# Patient Record
Sex: Female | Born: 1975 | Race: Black or African American | Hispanic: No | Marital: Married | Smoking: Never smoker
Health system: Southern US, Community
[De-identification: ages and names within clinical notes are randomized; demographics above are authoritative.]

## PROBLEM LIST (undated history)

## (undated) DIAGNOSIS — E119 Type 2 diabetes mellitus without complications: Secondary | ICD-10-CM

## (undated) DIAGNOSIS — E785 Hyperlipidemia, unspecified: Secondary | ICD-10-CM

## (undated) HISTORY — DX: Type 2 diabetes mellitus without complications: E11.9

## (undated) HISTORY — DX: Hyperlipidemia, unspecified: E78.5

---

## 2016-08-25 ENCOUNTER — Ambulatory Visit
Admission: EM | Admit: 2016-08-25 | Discharge: 2016-08-25 | Disposition: A | Discharge status: Home / Self Care | Payer: Managed Care, Other (non HMO) | Attending: Family Medicine | Admitting: Family Medicine

## 2016-08-25 DIAGNOSIS — G43009 Migraine without aura, not intractable, without status migrainosus: Secondary | ICD-10-CM

## 2016-08-25 MED ORDER — HYDROCODONE-ACETAMINOPHEN 5-325 MG PO TABS: 1.0000 | ORAL_TABLET | Freq: Four times a day (QID) | ORAL | 0 refills | Status: DC | PRN

## 2016-08-25 NOTE — ED Triage Notes (Signed)
Patient c/o headache for about 2 days and some dizziness. She took 800 mg Ibuprofen and it took the edge off but it just keeps coming back. She also states that he head hurts on a certain spot in her head and that she has pains that shoot down her neck.

## 2016-08-25 NOTE — ED Provider Notes (Signed)
[facility]    CSN: [id number] Arrival date & time: 08/25/16  0830     History   Chief Complaint Chief Complaint  Patient presents with  . Headache    Light Headed    HPI Lisa Keller is a 40 y.o. female.   The history is provided by the patient.  Headache  Pain location:  L parietal, L temporal and occipital Quality:  Dull Radiates to:  L neck Severity currently:  2/10 Severity at highest:  8/10 Onset quality:  Sudden Duration:  2 days Timing:  Intermittent Chronicity:  New Similar to prior headaches: yes   Context: not activity, not exposure to bright light, not caffeine, not coughing, not defecating, not eating, not stress, not exposure to cold air, not intercourse, not loud noise and not straining   Relieved by:  NSAIDs Associated symptoms: nausea and photophobia   Associated symptoms: no abdominal pain, no back pain, no blurred vision, no congestion, no cough, no diarrhea, no dizziness, no drainage, no ear pain, no eye pain, no facial pain, no fatigue, no fever, no focal weakness, no hearing loss, no loss of balance, no myalgias, no near-syncope, no neck pain, no neck stiffness, no numbness, no paresthesias, no seizures, no sinus pressure, no sore throat, no swollen glands, no syncope, no tingling, no URI, no visual change, no vomiting and no weakness     History reviewed. No pertinent past medical history.  There are no active problems to display for this patient.   History reviewed. No pertinent surgical history.  OB History    No data available       Home Medications    Prior to Admission medications   Medication Sig Start Date End Date Taking? Authorizing Provider  ibuprofen (ADVIL,MOTRIN) 800 MG tablet Take 800 mg by mouth as needed.   Yes Historical Provider, MD  norgestimate-ethinyl estradiol (ORTHO-CYCLEN,SPRINTEC,PREVIFEM) 0.25-35 MG-MCG tablet Take 1 tablet by mouth daily.   Yes Historical Provider, MD  HYDROcodone-acetaminophen  (NORCO/VICODIN) 5-325 MG tablet Take 1-2 tablets by mouth every 6 (six) hours as needed. 08/25/16   Payton Mccallum, MD    Family History Family History  Problem Relation Age of Onset  . Diabetes Mother   . Hypertension Father   . Diabetes Father     Social History Social History  Substance Use Topics  . Smoking status: Never Smoker  . Smokeless tobacco: Never Used  . Alcohol use No     Allergies   Review of patient's allergies indicates no known allergies.   Review of Systems Review of Systems  Constitutional: Negative for fatigue and fever.  HENT: Negative for congestion, ear pain, hearing loss, postnasal drip, sinus pressure and sore throat.   Eyes: Positive for photophobia. Negative for blurred vision and pain.  Respiratory: Negative for cough.   Cardiovascular: Negative for syncope and near-syncope.  Gastrointestinal: Positive for nausea. Negative for abdominal pain, diarrhea and vomiting.  Musculoskeletal: Negative for back pain, myalgias, neck pain and neck stiffness.  Neurological: Positive for headaches. Negative for dizziness, focal weakness, seizures, weakness, numbness, paresthesias and loss of balance.     Physical Exam Triage Vital Signs ED Triage Vitals  Enc Vitals Group     BP 08/25/16 0905 (!) 158/82     Pulse Rate 08/25/16 0905 (!) 56     Resp 08/25/16 0905 18     Temp 08/25/16 0905 98.9 F (37.2 C)     Temp Source 08/25/16 0905 Oral     SpO2 08/25/16  0905 100 %     Weight 08/25/16 0905 183 lb (83 kg)     Height 08/25/16 0905 5\' 6"  (1.676 m)     Head Circumference --      Peak Flow --      Pain Score 08/25/16 0904 7     Pain Loc --      Pain Edu? --      Excl. in GC? --    No data found.   Updated Vital Signs BP (!) 158/82 (BP Location: Left Arm)   Pulse (!) 56   Temp 98.9 F (37.2 C) (Oral)   Resp 18   Ht 5\' 6"  (1.676 m)   Wt 183 lb (83 kg)   LMP 08/14/2016   SpO2 100%   BMI 29.54 kg/m   Visual Acuity Right Eye Distance:     Left Eye Distance:   Bilateral Distance:    Right Eye Near:   Left Eye Near:    Bilateral Near:     Physical Exam  Constitutional: She is oriented to person, place, and time. She appears well-developed and well-nourished. No distress.  HENT:  Head: Normocephalic and atraumatic.  Right Ear: Tympanic membrane, external ear and ear canal normal.  Left Ear: Tympanic membrane, external ear and ear canal normal.  Nose: No mucosal edema, rhinorrhea, nose lacerations, sinus tenderness, nasal deformity, septal deviation or nasal septal hematoma. No epistaxis.  No foreign bodies. Right sinus exhibits no maxillary sinus tenderness and no frontal sinus tenderness. Left sinus exhibits no maxillary sinus tenderness and no frontal sinus tenderness.  Mouth/Throat: Uvula is midline, oropharynx is clear and moist and mucous membranes are normal. No oropharyngeal exudate.  Eyes: Conjunctivae and EOM are normal. Pupils are equal, round, and reactive to light. Right eye exhibits no discharge. Left eye exhibits no discharge. No scleral icterus.  Neck: Normal range of motion. Neck supple. No thyromegaly present.  Cardiovascular: Normal rate, regular rhythm and normal heart sounds.   Pulmonary/Chest: Effort normal and breath sounds normal. No respiratory distress. She has no wheezes. She has no rales.  Lymphadenopathy:    She has no cervical adenopathy.  Neurological: She is alert and oriented to person, place, and time. She has normal reflexes. She displays normal reflexes. No cranial nerve deficit. She exhibits normal muscle tone. Coordination normal.  Skin: She is not diaphoretic.  Psychiatric: Her behavior is normal. Thought content normal.  Nursing note and vitals reviewed.    UC Treatments / Results  Labs (all labs ordered are listed, but only abnormal results are displayed) Labs Reviewed - No data to display  EKG  EKG Interpretation None       Radiology No results  found.  Procedures Procedures (including critical care time)  Medications Ordered in UC Medications - No data to display   Initial Impression / Assessment and Plan / UC Course  I have reviewed the triage vital signs and the nursing notes.  Pertinent labs & imaging results that were available during my care of the patient were reviewed by me and considered in my medical decision making (see chart for details).  Clinical Course      Final Clinical Impressions(s) / UC Diagnoses   Final diagnoses:  Migraine without aura and without status migrainosus, not intractable    New Prescriptions Discharge Medication List as of 08/25/2016  9:26 AM    START taking these medications   Details  HYDROcodone-acetaminophen (NORCO/VICODIN) 5-325 MG tablet Take 1-2 tablets by mouth every 6 (six)  hours as needed., Starting Tue 08/25/2016, Print       1. diagnosis reviewed with patient 2. rx as per orders above; reviewed possible side effects, interactions, risks and benefits  3. Recommend supportive treatment otc NSAIDS prn 4. Follow-up prn if symptoms worsen or don't improve   Payton Mccallumrlando Nattaly Yebra, MD 08/25/16 1024

## 2016-08-29 ENCOUNTER — Telehealth: Payer: Self-pay

## 2016-08-29 NOTE — Telephone Encounter (Signed)
Courtesy call back completed today for patient's recent visit at [facility]. Patient did not answer, left message on machine to call back with any questions or concerns.   

## 2018-10-14 ENCOUNTER — Other Ambulatory Visit: Payer: Self-pay | Admitting: Obstetrics and Gynecology

## 2018-10-14 DIAGNOSIS — Z1231 Encounter for screening mammogram for malignant neoplasm of breast: Secondary | ICD-10-CM

## 2018-10-20 ENCOUNTER — Ambulatory Visit
Admission: RE | Admit: 2018-10-20 | Discharge: 2018-10-20 | Disposition: A | Discharge status: Home / Self Care | Payer: 59 | Source: Ambulatory Visit | Attending: Obstetrics and Gynecology | Admitting: Obstetrics and Gynecology

## 2018-10-20 DIAGNOSIS — Z1231 Encounter for screening mammogram for malignant neoplasm of breast: Secondary | ICD-10-CM | POA: Diagnosis present

## 2019-10-24 ENCOUNTER — Other Ambulatory Visit: Payer: Self-pay | Admitting: Obstetrics and Gynecology

## 2019-10-24 DIAGNOSIS — Z1231 Encounter for screening mammogram for malignant neoplasm of breast: Secondary | ICD-10-CM

## 2020-01-31 ENCOUNTER — Ambulatory Visit
Admission: RE | Admit: 2020-01-31 | Discharge: 2020-01-31 | Disposition: A | Discharge status: Home / Self Care | Payer: 59 | Source: Ambulatory Visit | Attending: Obstetrics and Gynecology | Admitting: Obstetrics and Gynecology

## 2020-01-31 DIAGNOSIS — Z1231 Encounter for screening mammogram for malignant neoplasm of breast: Secondary | ICD-10-CM

## 2020-03-09 ENCOUNTER — Ambulatory Visit: Payer: 59 | Attending: Internal Medicine

## 2020-03-09 DIAGNOSIS — Z23 Encounter for immunization: Secondary | ICD-10-CM

## 2020-03-09 NOTE — Progress Notes (Signed)
   Covid-19 Vaccination Clinic  Name:  Patti Shorb    MRN: [id number] DOB: September 13, 1976  03/09/2020  Ms. Lax was observed post Covid-19 immunization for 15 minutes without incident. She was provided with Vaccine Information Sheet and instruction to access the V-Safe system.   Ms. Kuck was instructed to call 911 with any severe reactions post vaccine: Marland Kitchen Difficulty breathing  . Swelling of face and throat  . A fast heartbeat  . A bad rash all over body  . Dizziness and weakness   Immunizations Administered    Name Date Dose VIS Date Route   Pfizer COVID-19 Vaccine 03/09/2020 12:44 PM 0.3 mL 11/10/2019 Intramuscular   Manufacturer: [employer], [employer]   Lot: [id number]   NDC: [address]-1

## 2020-04-06 ENCOUNTER — Ambulatory Visit: Payer: 59 | Attending: Internal Medicine

## 2020-04-06 DIAGNOSIS — Z23 Encounter for immunization: Secondary | ICD-10-CM

## 2020-04-06 NOTE — Progress Notes (Signed)
   Covid-19 Vaccination Clinic  Name:  Lisa Keller    MRN: [id number] DOB: 09-27-1976  04/06/2020  [name] was observed post Covid-19 immunization for 15 minutes without incident. She was provided with Vaccine Information Sheet and instruction to access the V-Safe system.   [name] was instructed to call 911 with any severe reactions post vaccine: Marland Kitchen Difficulty breathing  . Swelling of face and throat  . A fast heartbeat  . A bad rash all over body  . Dizziness and weakness   Immunizations Administered    Name Date Dose VIS Date Route   Pfizer COVID-19 Vaccine 04/06/2020 11:33 AM 0.3 mL 01/24/2019 Intramuscular   Manufacturer: [employer], [employer]   Lot: [id number]   NDC: [address]-7

## 2021-01-20 ENCOUNTER — Other Ambulatory Visit: Payer: Self-pay | Admitting: Obstetrics and Gynecology

## 2021-01-20 DIAGNOSIS — Z1231 Encounter for screening mammogram for malignant neoplasm of breast: Secondary | ICD-10-CM

## 2021-02-04 ENCOUNTER — Ambulatory Visit
Admission: RE | Admit: 2021-02-04 | Discharge: 2021-02-04 | Disposition: A | Discharge status: Home / Self Care | Payer: No Typology Code available for payment source | Source: Ambulatory Visit | Attending: Obstetrics and Gynecology | Admitting: Obstetrics and Gynecology

## 2021-02-04 ENCOUNTER — Other Ambulatory Visit: Payer: Self-pay

## 2021-02-04 DIAGNOSIS — Z1231 Encounter for screening mammogram for malignant neoplasm of breast: Secondary | ICD-10-CM | POA: Diagnosis not present

## 2021-10-16 ENCOUNTER — Other Ambulatory Visit: Payer: Self-pay | Admitting: Obstetrics and Gynecology

## 2021-10-16 DIAGNOSIS — Z1231 Encounter for screening mammogram for malignant neoplasm of breast: Secondary | ICD-10-CM

## 2022-01-13 IMAGING — MG DIGITAL SCREENING BILAT W/ TOMO W/ CAD
3 series · 3 of 7 positions shown · non-contrast
Comparison: Previous exam(s).

CLINICAL DATA: Screening.

EXAM:
DIGITAL SCREENING BILATERAL MAMMOGRAM WITH TOMO AND CAD

[R CC synth-2D]
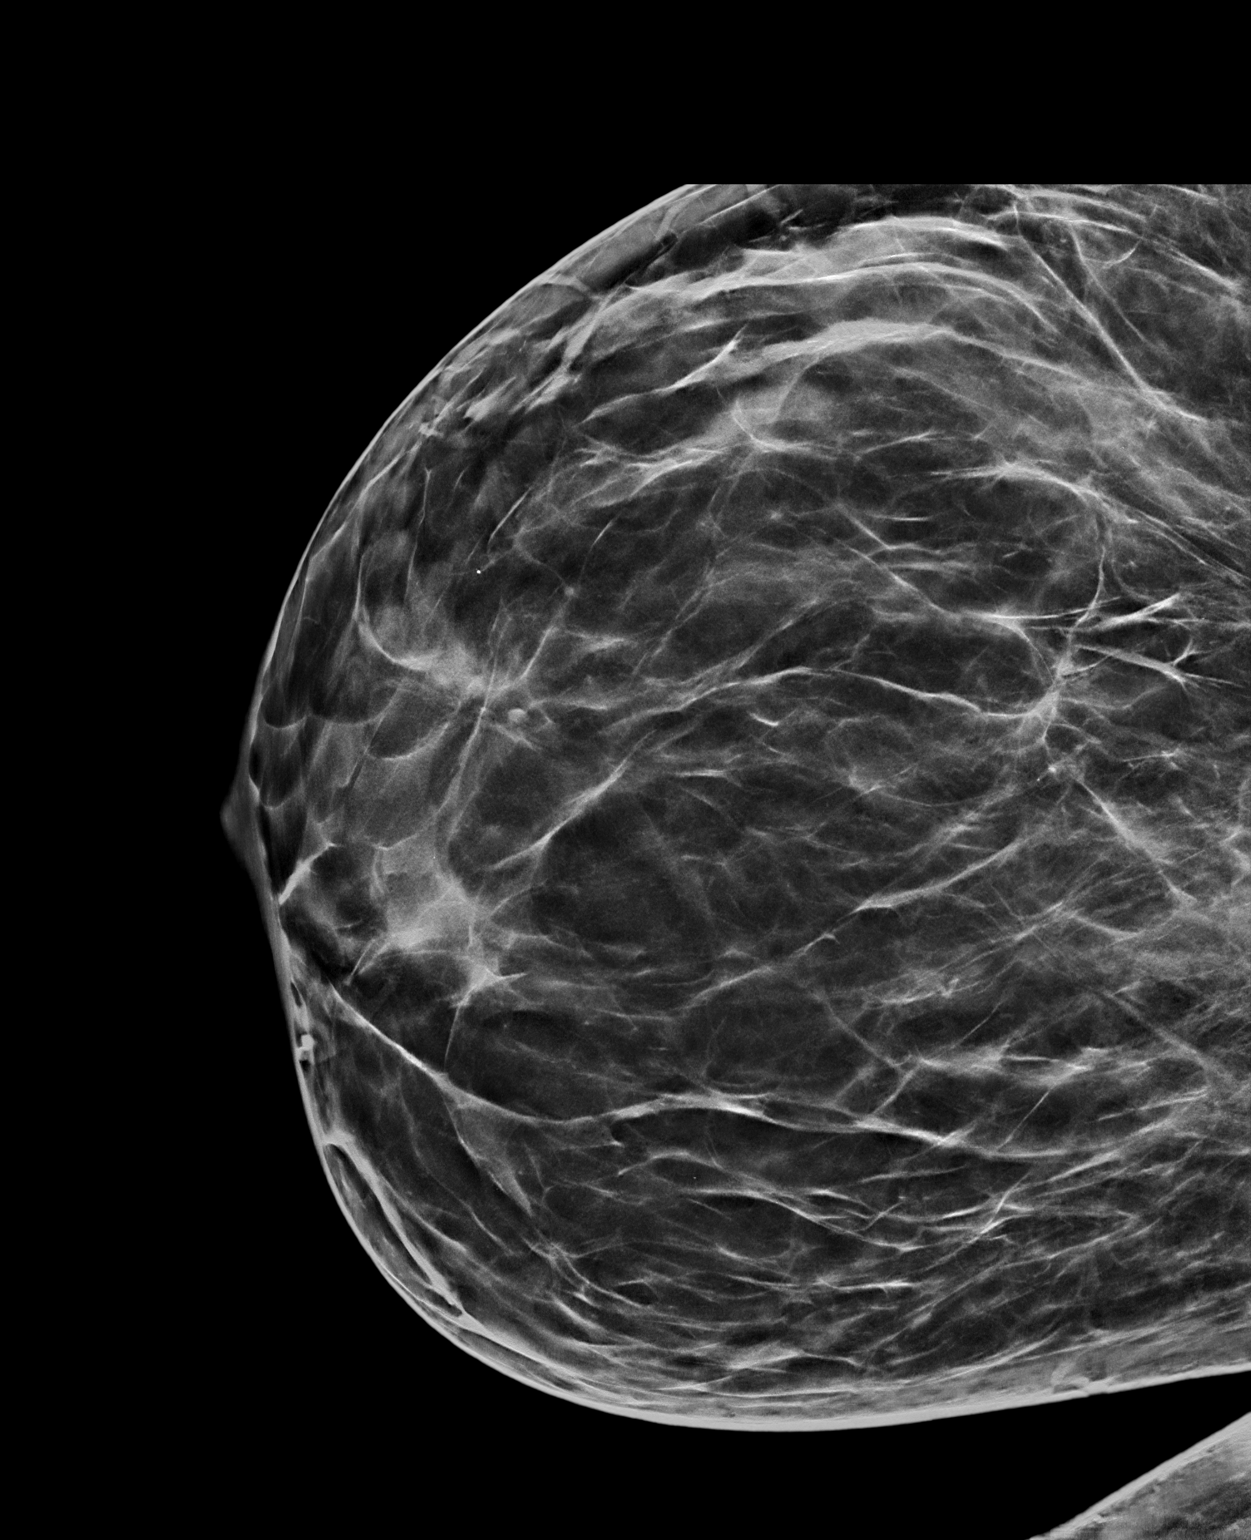

[L CC synth-2D]
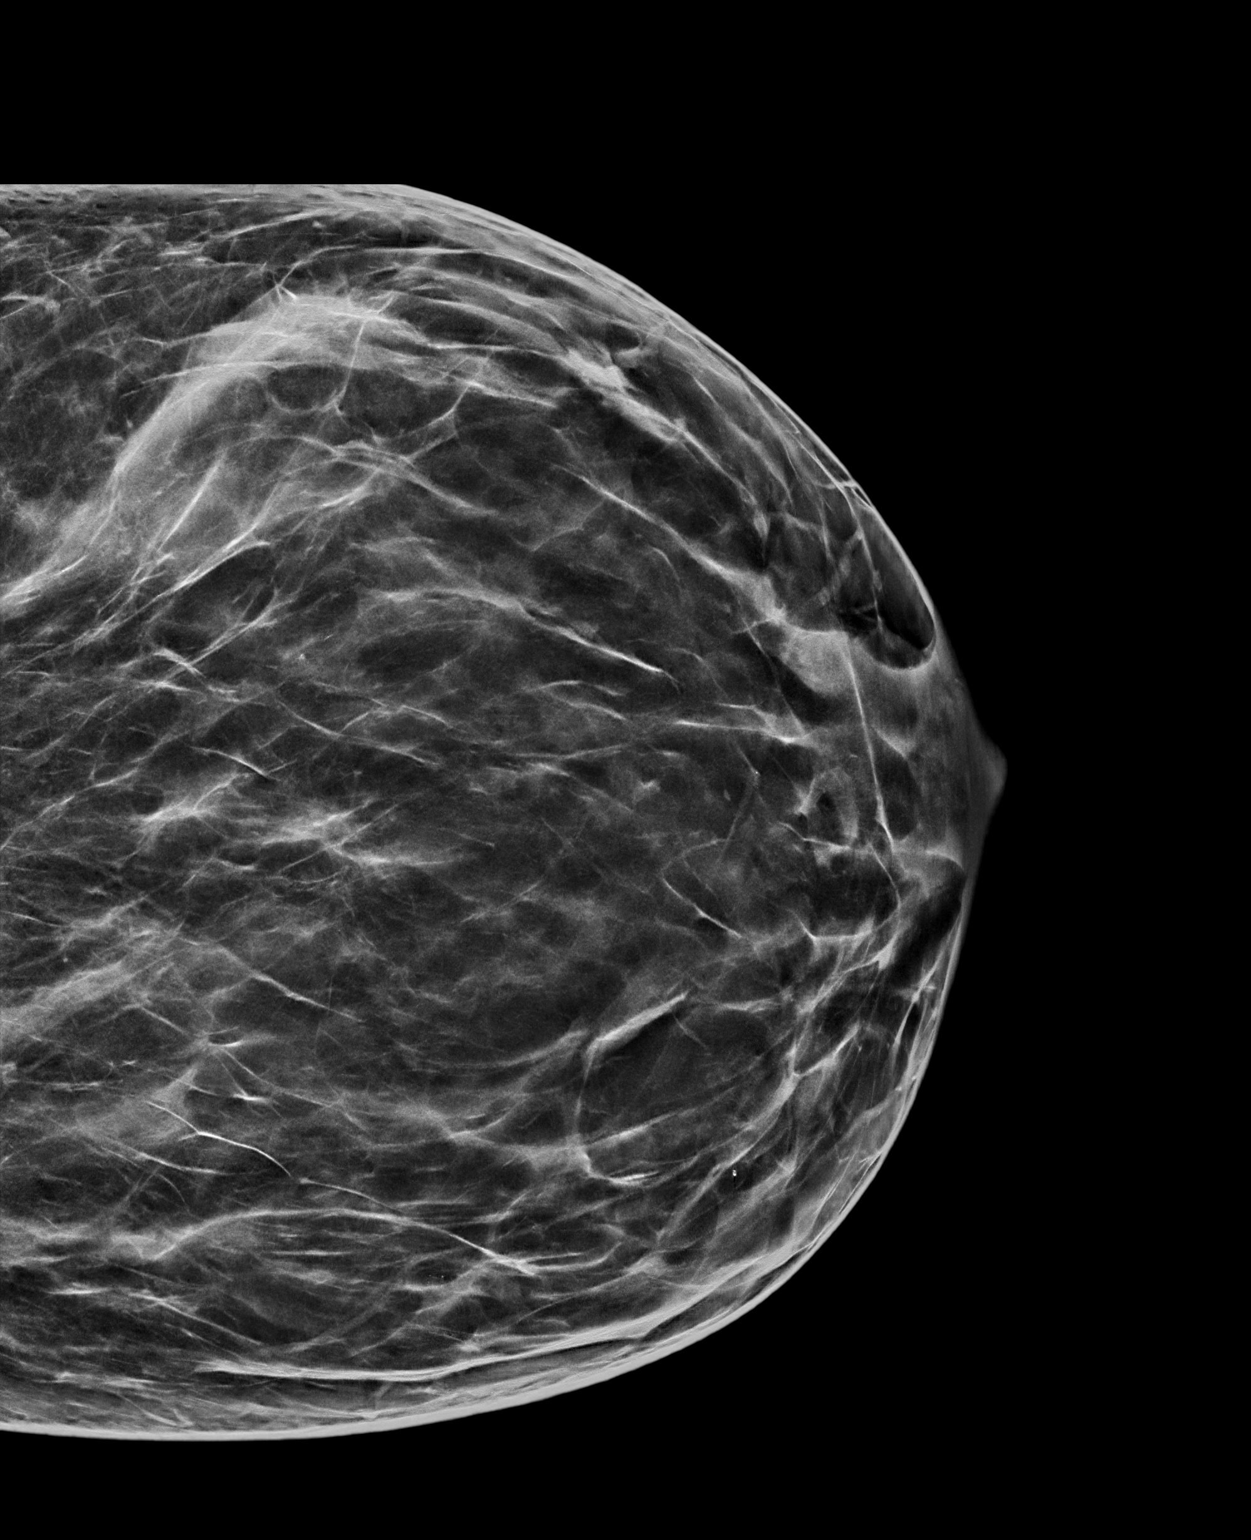

[L CC tomo · tomo slice 40/79.0]
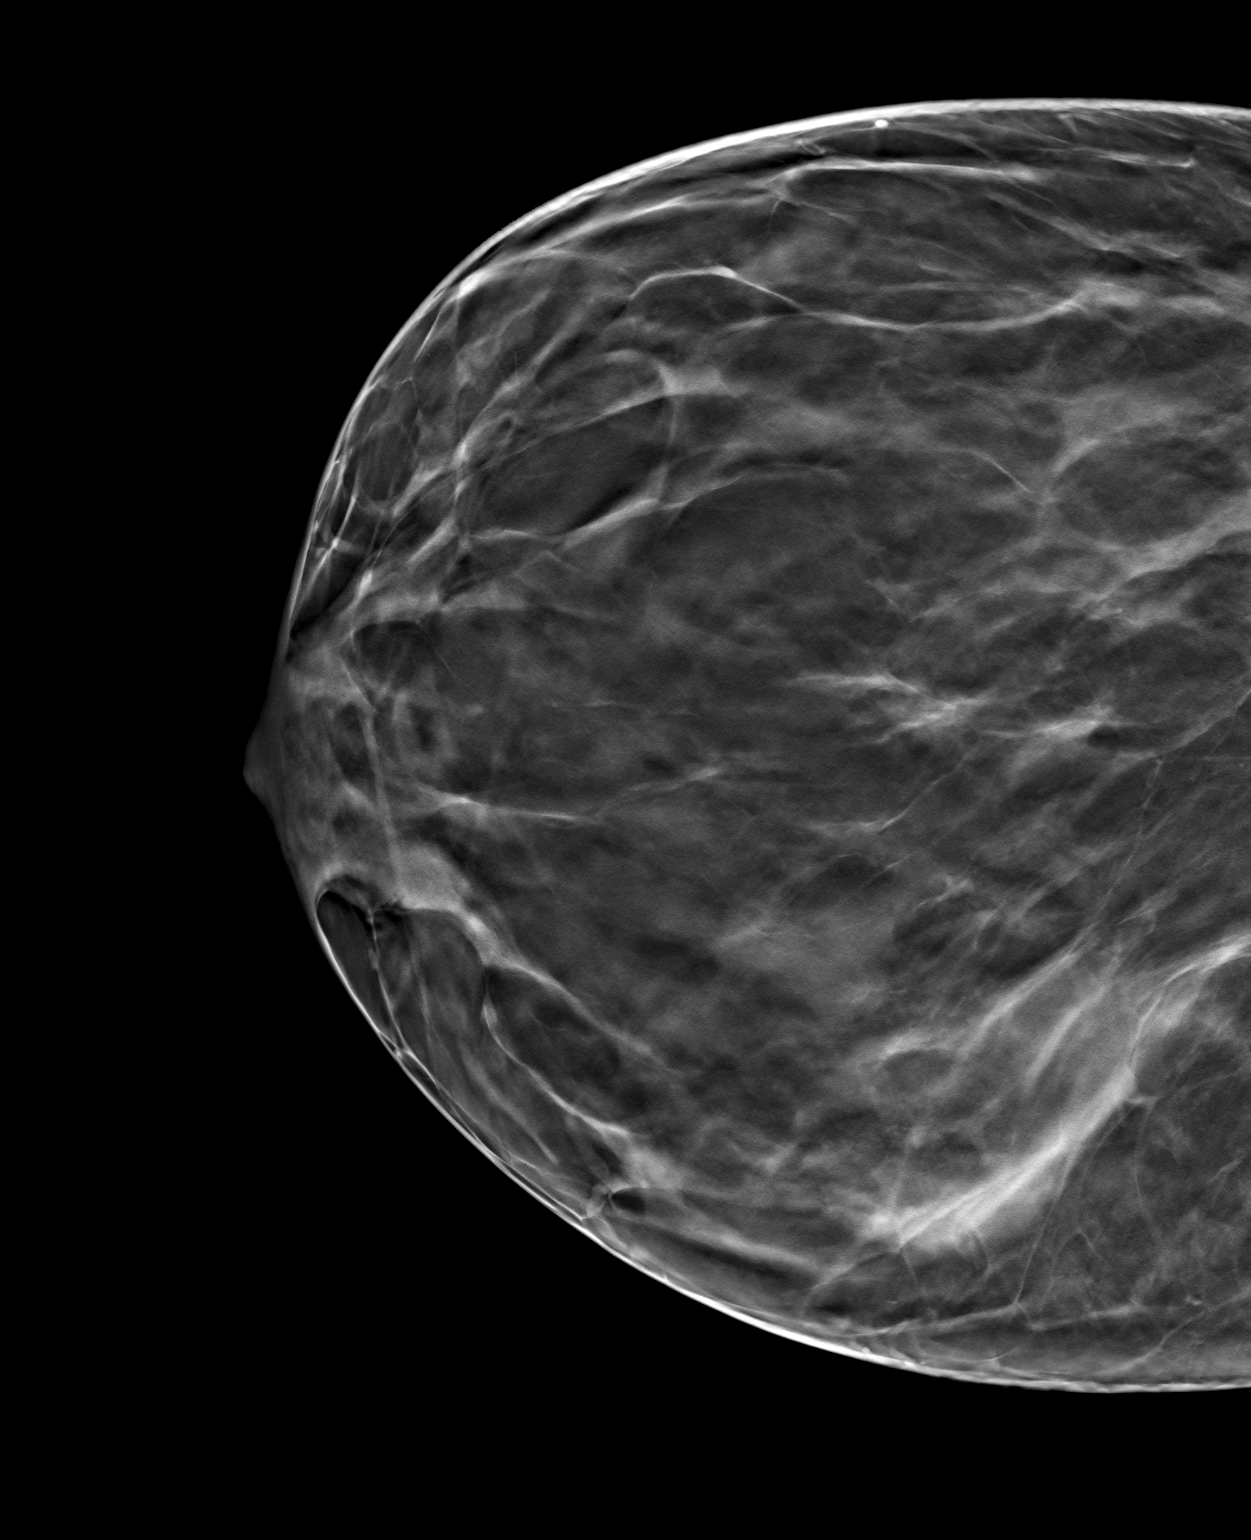

[3 of 7 positions shown; findings below may reference images not displayed]

ACR Breast Density Category c: The breast tissue is heterogeneously
dense, which may obscure small masses.
FINDINGS: There are no findings suspicious for malignancy. Images were
processed with CAD.
IMPRESSION: No mammographic evidence of malignancy. A result letter of this
screening mammogram will be mailed directly to the patient.

RECOMMENDATION:
Screening mammogram in one year. (Code:[id number])

BI-RADS CATEGORY  1: Negative.

## 2022-05-28 ENCOUNTER — Ambulatory Visit
Admission: RE | Admit: 2022-05-28 | Discharge: 2022-05-28 | Disposition: A | Discharge status: Home / Self Care | Payer: BC Managed Care – PPO | Source: Ambulatory Visit | Attending: Obstetrics and Gynecology | Admitting: Obstetrics and Gynecology

## 2022-05-28 ENCOUNTER — Other Ambulatory Visit: Payer: Self-pay | Admitting: Obstetrics and Gynecology

## 2022-05-28 DIAGNOSIS — Z1231 Encounter for screening mammogram for malignant neoplasm of breast: Secondary | ICD-10-CM | POA: Insufficient documentation

## 2022-05-28 DIAGNOSIS — N644 Mastodynia: Secondary | ICD-10-CM

## 2022-06-18 ENCOUNTER — Ambulatory Visit
Admission: RE | Admit: 2022-06-18 | Discharge: 2022-06-18 | Disposition: A | Discharge status: Home / Self Care | Payer: BC Managed Care – PPO | Source: Ambulatory Visit | Attending: Obstetrics and Gynecology | Admitting: Obstetrics and Gynecology

## 2022-06-18 ENCOUNTER — Other Ambulatory Visit: Payer: Self-pay | Admitting: Obstetrics and Gynecology

## 2022-06-18 DIAGNOSIS — N644 Mastodynia: Secondary | ICD-10-CM

## 2022-11-06 ENCOUNTER — Other Ambulatory Visit: Payer: Self-pay | Admitting: Obstetrics and Gynecology

## 2022-11-06 DIAGNOSIS — Z1231 Encounter for screening mammogram for malignant neoplasm of breast: Secondary | ICD-10-CM

## 2023-01-18 IMAGING — MG MM DIGITAL SCREENING BILAT W/ TOMO AND CAD
8 series · 8 of 24 positions shown · non-contrast
Comparison: Previous exam(s).

CLINICAL DATA: Screening.

EXAM:
DIGITAL SCREENING BILATERAL MAMMOGRAM WITH TOMOSYNTHESIS AND CAD
TECHNIQUE: Bilateral screening digital craniocaudal and mediolateral oblique
mammograms were obtained. Bilateral screening digital breast
tomosynthesis was performed. The images were evaluated with
computer-aided detection.

[R CC synth-2D]
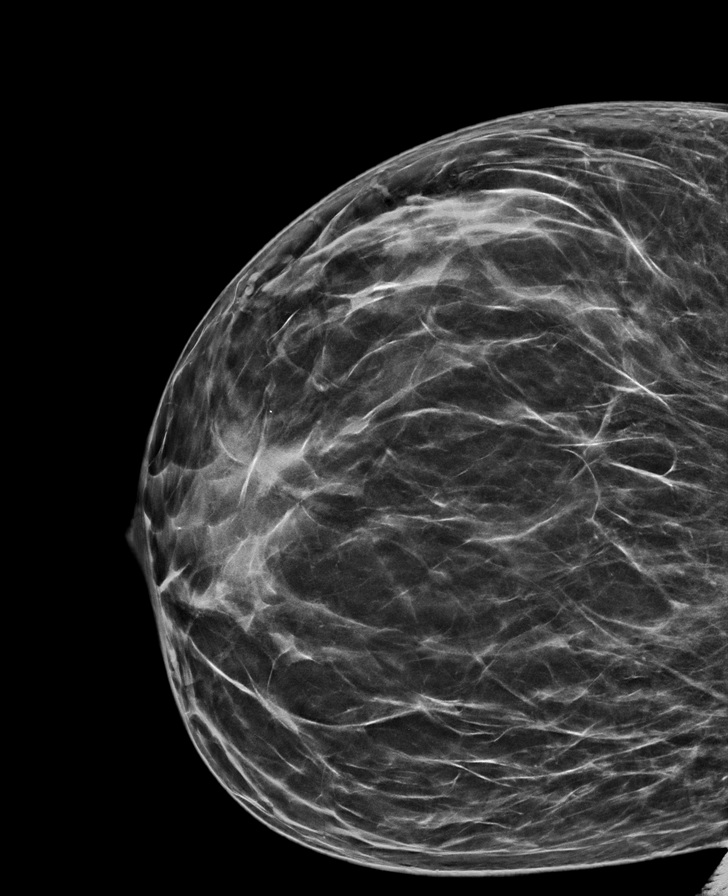

[R MLO synth-2D]
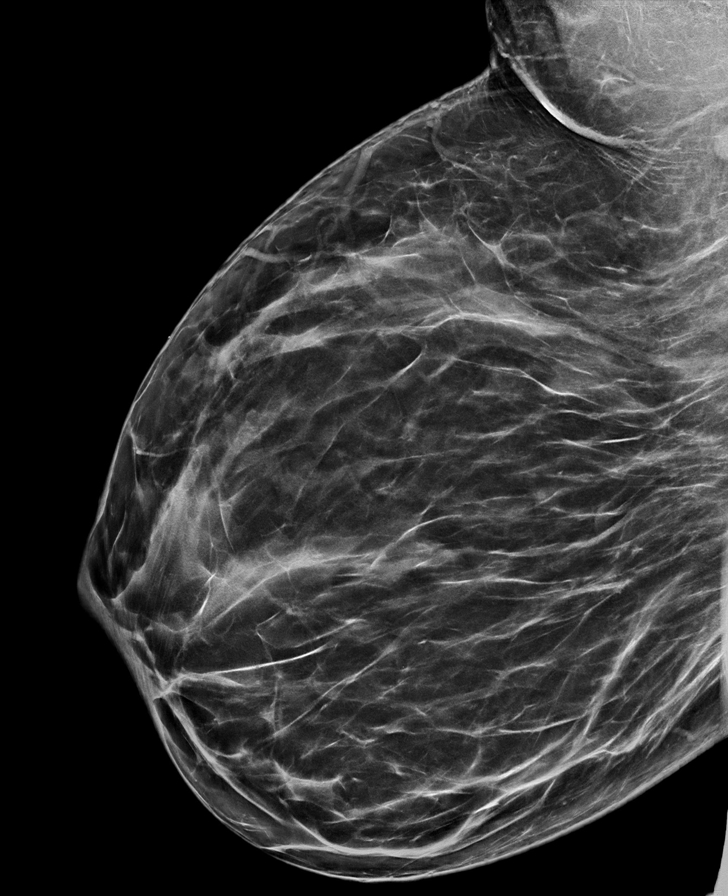

[L CC synth-2D]
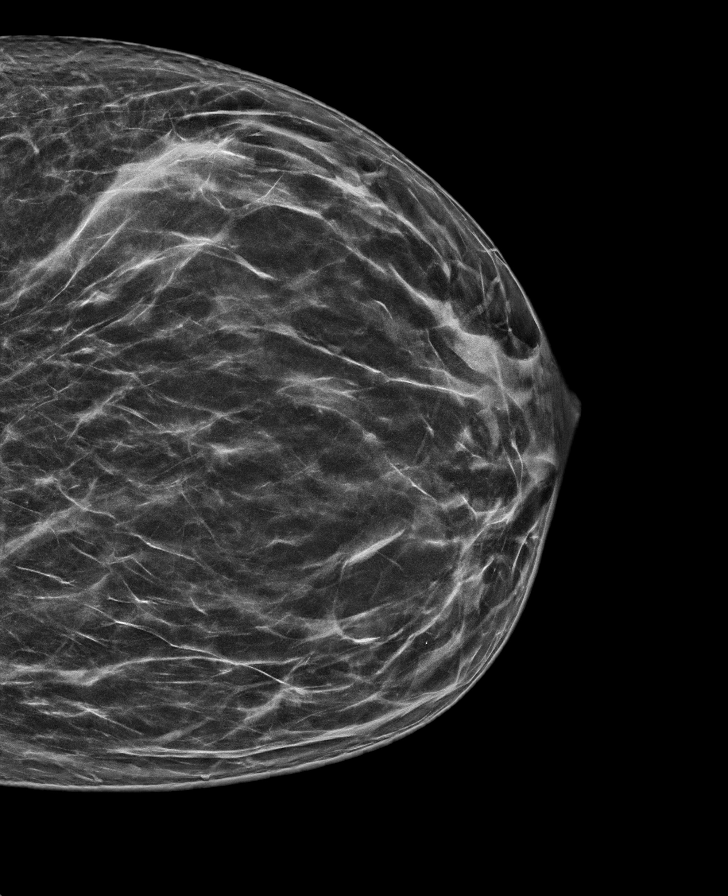

[L MLO synth-2D]
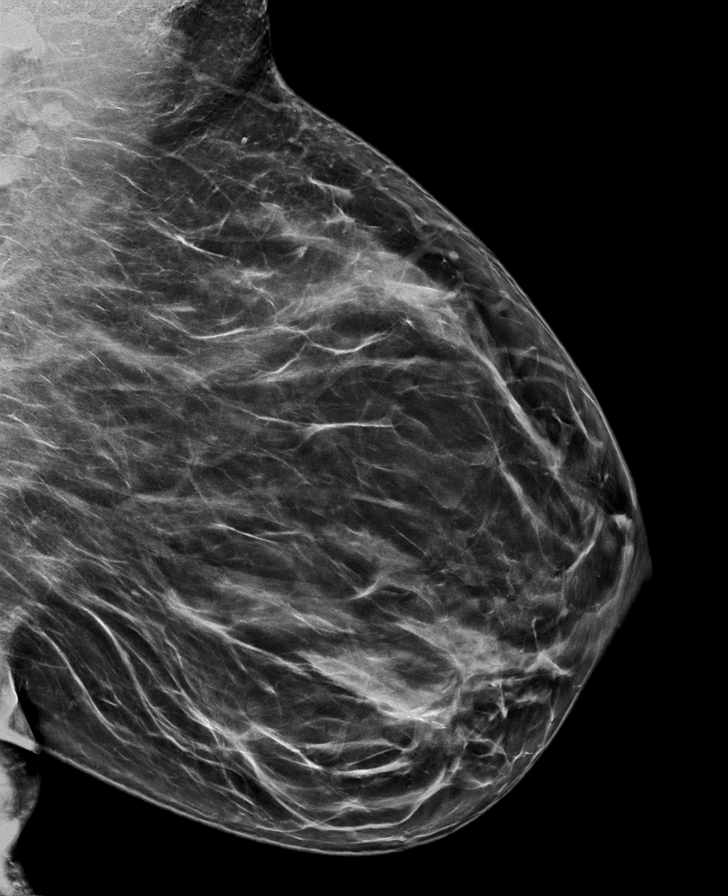

[L CC tomo · tomo slice 35/69.0]
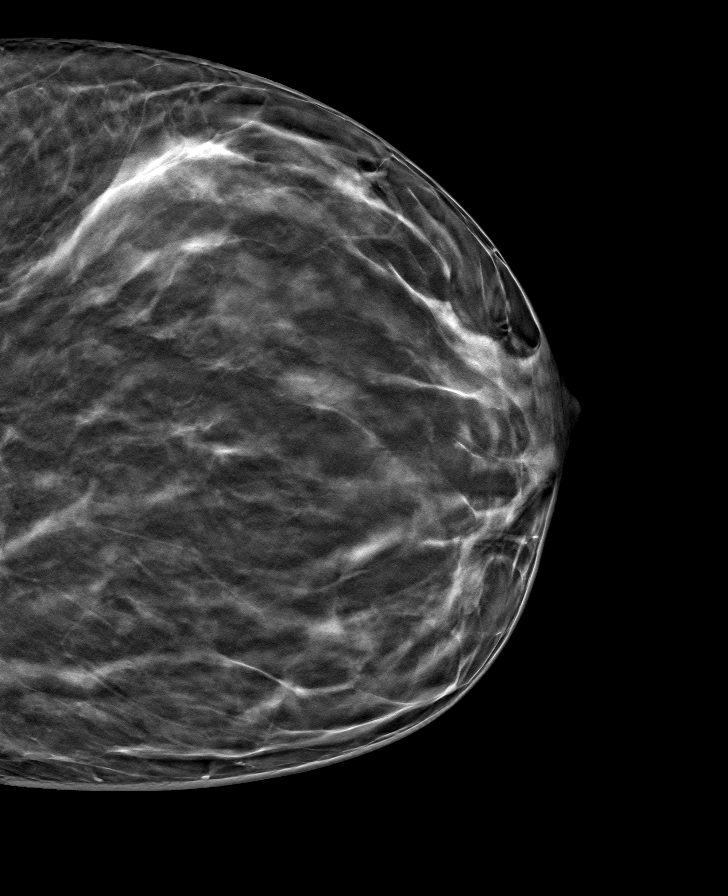

[L MLO tomo · tomo slice 41/81.0]
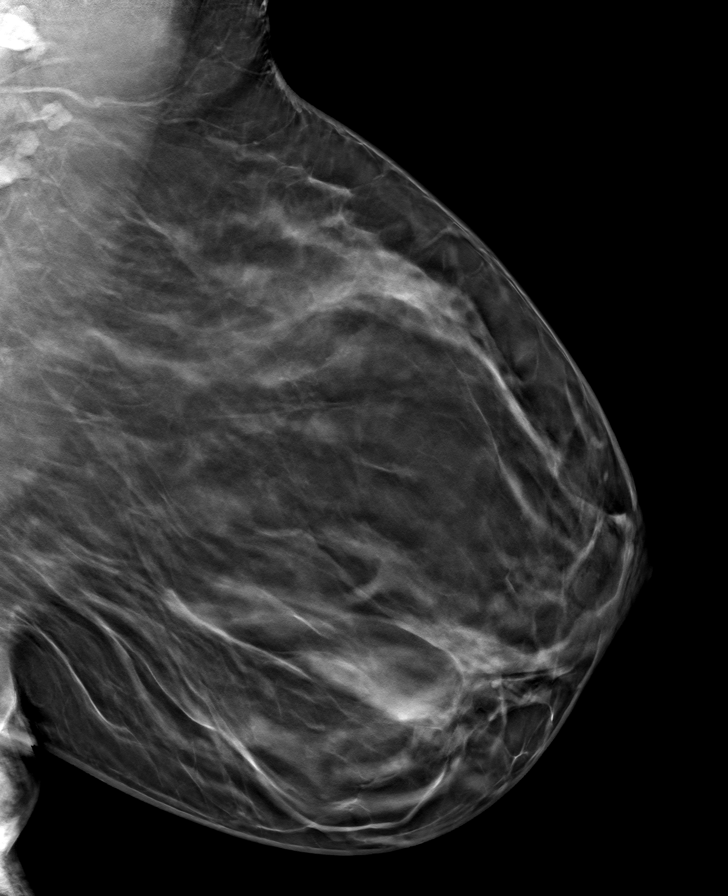

[R CC tomo · tomo slice 35/69.0]
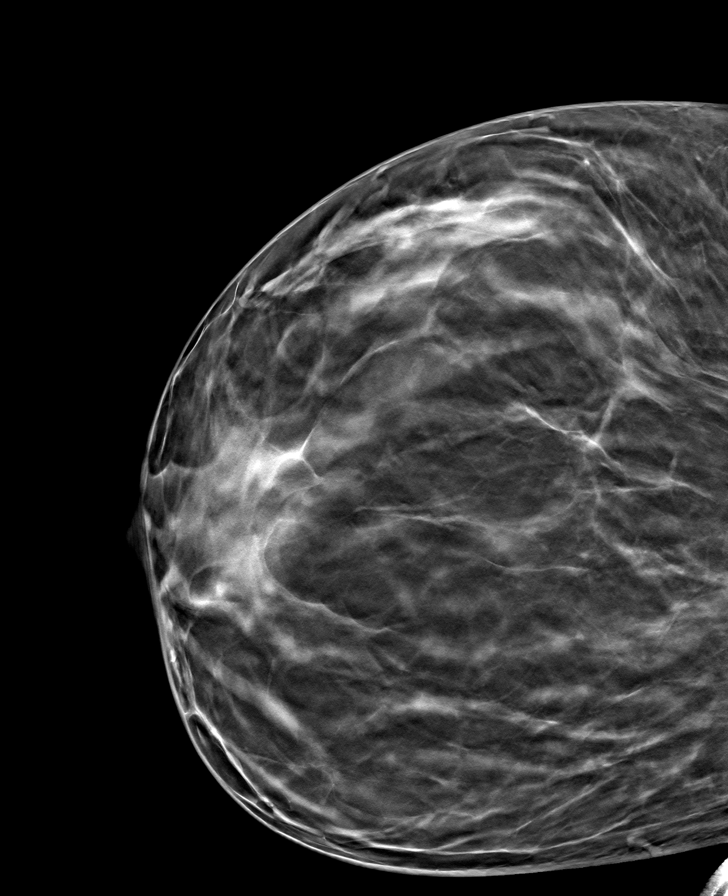

[R MLO tomo · tomo slice 39/76.0]
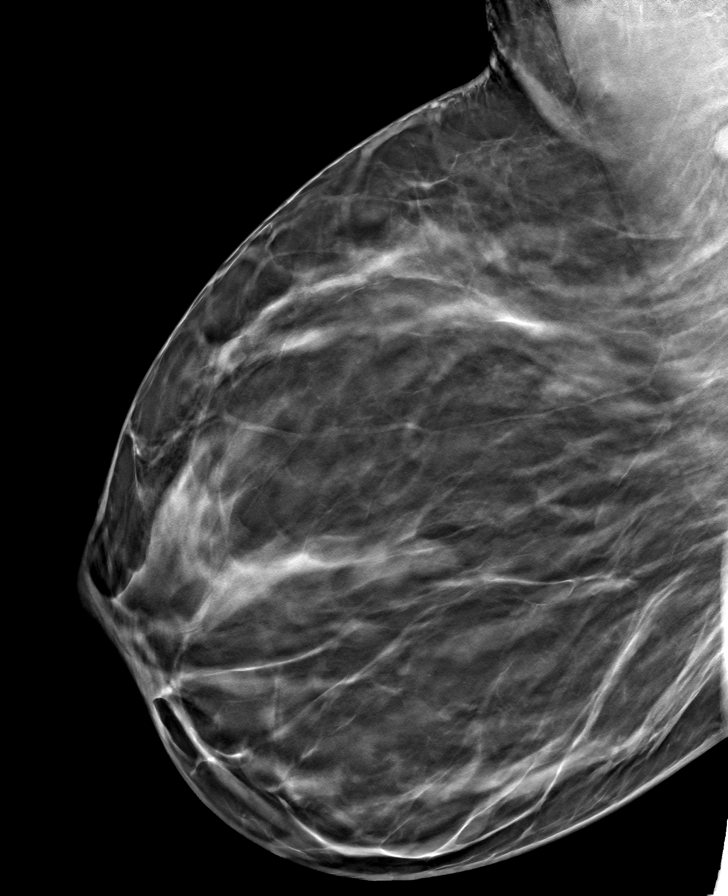

[8 of 24 positions shown; findings below may reference images not displayed]

ACR Breast Density Category b: There are scattered areas of
fibroglandular density.
FINDINGS: There are no findings suspicious for malignancy. The images were
evaluated with computer-aided detection.
IMPRESSION: No mammographic evidence of malignancy. A result letter of this
screening mammogram will be mailed directly to the patient.

RECOMMENDATION:
Screening mammogram in one year. (Code:[id number])

BI-RADS CATEGORY  1: Negative.

## 2023-07-21 ENCOUNTER — Other Ambulatory Visit: Payer: Self-pay | Admitting: Family Medicine

## 2023-07-21 DIAGNOSIS — Z1231 Encounter for screening mammogram for malignant neoplasm of breast: Secondary | ICD-10-CM

## 2024-01-25 ENCOUNTER — Telehealth: Payer: Self-pay

## 2024-01-25 NOTE — Telephone Encounter (Signed)
 The patient mother Lisa Keller) called in to try to schedule her daughter appointment. I told her that we will have to speak with her daughter and the patient mother said that she will call back. The patient called back fast. I inform her that she was seen at [facility] on 12/21/23 and we sent her a letter about her colonoscopy on 11/09/2022. The patient verified that she seen [facility] and they schedule her for a colonoscopy. She was able to get the prep and every for the colonoscopy because it was due on 01/11/24. Then they called her to let her know that she is OON with them but they can still do it but she would need to pay OOP. She told them that she was not doing that. They advised her to find a practice that is INN with her insurance and they will send the referral. She said that we were INN with her insurance. I inform her that our provider will be leaving in June to go to [facility] so it's best if she get her referral sent to [facility] because she referral is for rectal bleeding. She said that [facility] said all she needs is a colonoscopy but she wants to stay with our practice and when that time comes she will switch. I let her know that Ginger and Dr. Tobi Bastos will have to look at her referral to see what we can do. The patient said that is fine and for [place] to call her mobile and if she don't answer please call her work number. The patient added her work number that we can call.

## 2024-01-27 ENCOUNTER — Encounter: Payer: Self-pay | Admitting: Physician Assistant

## 2024-01-27 ENCOUNTER — Ambulatory Visit (INDEPENDENT_AMBULATORY_CARE_PROVIDER_SITE_OTHER): Payer: PRIVATE HEALTH INSURANCE | Admitting: Physician Assistant

## 2024-01-27 VITALS — BP 138/84 | HR 86 | Temp 98.3°F | Ht 66.0 in | Wt 196.1 lb

## 2024-01-27 DIAGNOSIS — K625 Hemorrhage of anus and rectum: Secondary | ICD-10-CM

## 2024-01-27 DIAGNOSIS — D649 Anemia, unspecified: Secondary | ICD-10-CM | POA: Diagnosis not present

## 2024-01-27 MED ORDER — NA SULFATE-K SULFATE-MG SULF 17.5-3.13-1.6 GM/177ML PO SOLN: 1.0000 | Freq: Once | ORAL | 0 refills | Status: AC

## 2024-01-27 NOTE — Progress Notes (Signed)
 Lisa Amy, PA-C [address]  [address]  [place], [place] [address]  Main: [phone]  Fax: [phone]   Gastroenterology Consultation  Referring Provider:     Cassell Clement, MD Primary Care Physician:  Lisa Clement, MD Primary Gastroenterologist:  Lisa Amy, PA-C / Dr. Wyline Keller   Reason for Consultation:     Rectal bleeding, anemia        HPI:   Lisa Keller is a 48 y.o. y/o female referred for consultation & management  by Lisa Clement, MD.    Patient went to [facility] ED [facility] 11/19/2023 to evaluate acute bloody diarrhea for 3 days.  Symptoms started 30 minutes after she ate at [facility] in [place].  Hemoglobin 8.2, MCV 87, elevated white count 17.4.  Was treated with Cipro 500 twice daily for 5 days to treat infectious diarrhea.  GI symptoms resolved after antibiotic treatment.  She has not had any more diarrhea or rectal bleeding since December.  Currently taking OTC iron tablet once daily.  No recent labs.  Previous hemoglobin 07/2023 was 13g.  She was told to follow-up with GI to schedule colonoscopy.  No previous colonoscopy or GI evaluation.  She had negative Cologuard through her PCP last year.  No family history of colon cancer.  She denies any upper GI symptoms.  Denies menorrhagia.  Has IUD with very light menses.  Does not donate blood.  10/2023 labs: Very low hemoglobin 8.2, hematocrit 25, MCV 87.  Elevated white count 17.4.  Currently on OTC iron tablet once daily.   Past Medical History:  Diagnosis Date   Diabetes (HCC)    Hyperlipidemia     History reviewed. No pertinent surgical history.  Prior to Admission medications   Medication Sig Start Date End Date Taking? Authorizing Provider  atorvastatin (LIPITOR) 10 MG tablet Take 10 mg by mouth daily.   Yes [provider]  ferrous sulfate 325 (65 FE) MG EC tablet Take 325 mg by mouth daily.   Yes [provider]  Lisa Keller 10 MG TABS tablet Take 10 mg by  mouth daily. 08/10/23  Yes [provider]  metFORMIN (GLUCOPHAGE) 500 MG tablet Take 500 mg by mouth 2 (two) times daily.   Yes [provider]  norgestimate-ethinyl estradiol (Lisa Keller,SPRINTEC,PREVIFEM) 0.25-35 MG-MCG tablet Take 1 tablet by mouth daily.   Yes [provider]     Family History  Problem Relation Age of Onset   Diabetes Mother    Hypertension Father    Diabetes Father    Breast cancer Neg Hx      Social History   Tobacco Use   Smoking status: Never   Smokeless tobacco: Never  Substance Use Topics   Alcohol use: No   Drug use: No    Allergies as of 01/27/2024   (No Known Allergies)    Review of Systems:    All systems reviewed and negative except where noted in HPI.   Physical Exam:  BP 138/84   Pulse 86   Temp 98.3 F (36.8 C) (Oral)   Ht 5\' 6"  (1.676 m)   Wt 196 lb 2 oz (89 kg)   BMI 31.66 kg/m  No LMP recorded. (Menstrual status: IUD).  General:   Alert,  Well-developed, well-nourished, pleasant and cooperative in NAD Lungs:  Respirations even and unlabored.  Clear throughout to auscultation.   No wheezes, crackles, or rhonchi. No acute distress. Heart:  Regular rate and rhythm; no murmurs, clicks, rubs, or gallops. Abdomen:  Normal bowel sounds.  No bruits.  Soft, and non-distended without masses, hepatosplenomegaly or hernias noted.  No Tenderness.  No guarding or rebound tenderness.    Neurologic:  Alert and oriented x3;  grossly normal neurologically. Psych:  Alert and cooperative.  Anxious Keller and affect.  Imaging Studies: No results found.  Assessment and Plan:   Lisa Keller is a 48 y.o. y/o female has been referred for episode of acute bloody diarrhea which occurred December 2024.  Symptoms lasted 3 or 4 days and resolved after antibiotic treatment.  Symptoms most consistent with acute infectious colitis/food poisoning.  Currently asymptomatic.  I'm scheduling follow-up colonoscopy.  She has never had  a colonoscopy.  1.  Rectal bleeding - Due to acute infectious colitis.  Currently Resolved.    Scheduling Colonoscopy I discussed risks of colonoscopy with patient to include risk of bleeding, colon perforation, and risk of sedation.  Patient expressed understanding and agrees to proceed with colonoscopy.   2.  Anemia -most likely lower GI bleed which occurred 10/2023 from acute infectious colitis.  Labs: CBC, iron panel, ferritin, B12, folate.  She is currently taking OTC iron tablet once daily since December.  If labs are normal, then she can stop iron.  She denies menorrhagia.  If she has persistent iron deficiency anemia, then add to EGD procedure.  She denies any current GI symptoms.  3.  Acute diarrhea, resolved post antibiotic treatment.   Symptoms are most consistent with acute infectious diarrhea/food poisoning which has currently resolved.  Follow up based on colonoscopy results.  Also follow-up if she has recurrent GI symptoms.  Lisa Amy, PA-C

## 2024-01-28 ENCOUNTER — Encounter: Payer: Self-pay | Admitting: Physician Assistant

## 2024-01-28 LAB — CBC WITH DIFFERENTIAL/PLATELET
Basophils Absolute: 0 10*3/uL (ref 0.0–0.2)
Basos: 1 %
EOS (ABSOLUTE): 0.2 10*3/uL (ref 0.0–0.4)
Eos: 2 %
Hematocrit: 40.4 % (ref 34.0–46.6)
Hemoglobin: 13 g/dL (ref 11.1–15.9)
Immature Grans (Abs): 0 10*3/uL (ref 0.0–0.1)
Immature Granulocytes: 0 %
Lymphocytes Absolute: 2.6 10*3/uL (ref 0.7–3.1)
Lymphs: 35 %
MCH: 27.4 pg (ref 26.6–33.0)
MCHC: 32.2 g/dL (ref 31.5–35.7)
MCV: 85 fL (ref 79–97)
Monocytes Absolute: 0.4 10*3/uL (ref 0.1–0.9)
Monocytes: 6 %
Neutrophils Absolute: 4.3 10*3/uL (ref 1.4–7.0)
Neutrophils: 56 %
Platelets: 470 10*3/uL — ABNORMAL HIGH (ref 150–450)
RBC: 4.75 x10E6/uL (ref 3.77–5.28)
RDW: 13.4 % (ref 11.7–15.4)
WBC: 7.5 10*3/uL (ref 3.4–10.8)

## 2024-01-28 LAB — IRON,TIBC AND FERRITIN PANEL
Ferritin: 22 ng/mL (ref 15–150)
Iron Saturation: 9 % — CL (ref 15–55)
Iron: 33 ug/dL (ref 27–159)
Total Iron Binding Capacity: 380 ug/dL (ref 250–450)
UIBC: 347 ug/dL (ref 131–425)

## 2024-01-28 LAB — B12 AND FOLATE PANEL
Folate: 9.6 ng/mL (ref >3.0)
Vitamin B-12: 797 pg/mL (ref 232–1245)

## 2024-03-24 ENCOUNTER — Encounter: Payer: Self-pay | Admitting: Gastroenterology

## 2024-03-24 ENCOUNTER — Other Ambulatory Visit: Payer: Self-pay

## 2024-03-24 ENCOUNTER — Encounter
Admission: RE | Disposition: A | Discharge status: Home / Self Care | Payer: Self-pay | Source: Home / Self Care | Attending: Gastroenterology

## 2024-03-24 ENCOUNTER — Ambulatory Visit
Admission: RE | Admit: 2024-03-24 | Discharge: 2024-03-24 | Disposition: A | Discharge status: Home / Self Care | Payer: PRIVATE HEALTH INSURANCE | Attending: Gastroenterology | Admitting: Gastroenterology

## 2024-03-24 ENCOUNTER — Ambulatory Visit: Payer: PRIVATE HEALTH INSURANCE | Admitting: Anesthesiology

## 2024-03-24 DIAGNOSIS — Z7984 Long term (current) use of oral hypoglycemic drugs: Secondary | ICD-10-CM | POA: Diagnosis not present

## 2024-03-24 DIAGNOSIS — E119 Type 2 diabetes mellitus without complications: Secondary | ICD-10-CM | POA: Diagnosis not present

## 2024-03-24 DIAGNOSIS — K640 First degree hemorrhoids: Secondary | ICD-10-CM | POA: Diagnosis not present

## 2024-03-24 DIAGNOSIS — K5730 Diverticulosis of large intestine without perforation or abscess without bleeding: Secondary | ICD-10-CM

## 2024-03-24 DIAGNOSIS — D120 Benign neoplasm of cecum: Secondary | ICD-10-CM

## 2024-03-24 DIAGNOSIS — K625 Hemorrhage of anus and rectum: Secondary | ICD-10-CM | POA: Diagnosis present

## 2024-03-24 DIAGNOSIS — D126 Benign neoplasm of colon, unspecified: Secondary | ICD-10-CM

## 2024-03-24 DIAGNOSIS — D125 Benign neoplasm of sigmoid colon: Secondary | ICD-10-CM

## 2024-03-24 HISTORY — PX: HEMOSTASIS CLIP PLACEMENT: SHX6857

## 2024-03-24 HISTORY — PX: COLONOSCOPY WITH PROPOFOL: SHX5780

## 2024-03-24 HISTORY — PX: POLYPECTOMY: SHX149

## 2024-03-24 LAB — POCT PREGNANCY, URINE: Preg Test, Ur: NEGATIVE

## 2024-03-24 LAB — GLUCOSE, CAPILLARY: Glucose-Capillary: 119 mg/dL — ABNORMAL HIGH (ref 70–99)

## 2024-03-24 SURGERY — COLONOSCOPY WITH PROPOFOL
Anesthesia: General

## 2024-03-24 MED ORDER — PROPOFOL 500 MG/50ML IV EMUL
INTRAVENOUS | Status: DC | PRN
  Administered 2024-03-24: 140 ug/kg/min via INTRAVENOUS

## 2024-03-24 MED ORDER — SODIUM CHLORIDE 0.9 % IV SOLN: INTRAVENOUS | Status: DC

## 2024-03-24 MED ORDER — LIDOCAINE HCL (CARDIAC) PF 100 MG/5ML IV SOSY
PREFILLED_SYRINGE | INTRAVENOUS | Status: DC | PRN
  Administered 2024-03-24: 60 mg via INTRAVENOUS

## 2024-03-24 NOTE — Anesthesia Preprocedure Evaluation (Signed)
 Anesthesia Evaluation  Patient identified by MRN, date of birth, ID band Patient awake    Reviewed: Allergy & Precautions, NPO status , Patient's Chart, lab work & pertinent test results  History of Anesthesia Complications Negative for: history of anesthetic complications  Airway Mallampati: III   Neck ROM: Full    Dental  (+) Missing   Pulmonary neg pulmonary ROS   Pulmonary exam normal breath sounds clear to auscultation       Cardiovascular Exercise Tolerance: Good negative cardio ROS Normal cardiovascular exam Rhythm:Regular Rate:Normal     Neuro/Psych negative neurological ROS     GI/Hepatic negative GI ROS,,,  Endo/Other  diabetes, Type 2    Renal/GU negative Renal ROS     Musculoskeletal   Abdominal   Peds  Hematology negative hematology ROS (+)   Anesthesia Other Findings   Reproductive/Obstetrics                             Anesthesia Physical Anesthesia Plan  ASA: 2  Anesthesia Plan: General   Post-op Pain Management:    Induction: Intravenous  PONV Risk Score and Plan: 3 and Propofol  infusion, TIVA and Treatment may vary due to age or medical condition  Airway Management Planned: Natural Airway  Additional Equipment:   Intra-op Plan:   Post-operative Plan:   Informed Consent: I have reviewed the patients History and Physical, chart, labs and discussed the procedure including the risks, benefits and alternatives for the proposed anesthesia with the patient or authorized representative who has indicated his/her understanding and acceptance.       Plan Discussed with: CRNA  Anesthesia Plan Comments: (LMA/GETA backup discussed.  Patient consented for risks of anesthesia including but not limited to:  - adverse reactions to medications - damage to eyes, teeth, lips or other oral mucosa - nerve damage due to positioning  - sore throat or hoarseness - damage  to heart, brain, nerves, lungs, other parts of body or loss of life  Informed patient about role of CRNA in peri- and intra-operative care.  Patient voiced understanding.)       Anesthesia Quick Evaluation

## 2024-03-24 NOTE — Anesthesia Postprocedure Evaluation (Signed)
 Anesthesia Post Note  Patient: Samyukta Flener  Procedure(s) Performed: COLONOSCOPY WITH PROPOFOL  POLYPECTOMY, INTESTINE CONTROL OF HEMORRHAGE, GI TRACT, ENDOSCOPIC, BY CLIPPING OR OVERSEWING  Patient location during evaluation: PACU Anesthesia Type: General Level of consciousness: awake and alert, oriented and patient cooperative Pain management: pain level controlled Vital Signs Assessment: post-procedure vital signs reviewed and stable Respiratory status: spontaneous breathing, nonlabored ventilation and respiratory function stable Cardiovascular status: blood pressure returned to baseline and stable Postop Assessment: adequate PO intake Anesthetic complications: no   No notable events documented.   Last Vitals:  Vitals:   03/24/24 1114 03/24/24 1126  BP: 120/82 (!) 141/92  Pulse: 66 65  Resp: (!) 23 (!) 21  Temp: (!) 35.6 C   SpO2: 100% 100%    Last Pain:  Vitals:   03/24/24 1126  TempSrc:   PainSc: 0-No pain                 Dorothey Gate

## 2024-03-24 NOTE — H&P (Signed)
 Luke Salaam, MD [address], [address], [place], [place], [address] [address], [place], [place], [address] Phone: [phone]  Fax: [phone]  Primary Care Physician:  Demetra Filter, NP   Pre-Procedure History & Physical: HPI:  Lisa Keller is a 48 y.o. female is here for an colonoscopy.   Past Medical History:  Diagnosis Date   Diabetes (HCC)    Hyperlipidemia     History reviewed. No pertinent surgical history.  Prior to Admission medications   Medication Sig Start Date End Date Taking? Authorizing Provider  atorvastatin (LIPITOR) 10 MG tablet Take 10 mg by mouth daily.   Yes [provider]  ferrous sulfate 325 (65 FE) MG EC tablet Take 325 mg by mouth daily.   Yes [provider]  JARDIANCE 10 MG TABS tablet Take 10 mg by mouth daily. 08/10/23  Yes [provider]  metFORMIN (GLUCOPHAGE) 500 MG tablet Take 500 mg by mouth 2 (two) times daily.   Yes [provider]  norgestimate-ethinyl estradiol (ORTHO-CYCLEN,SPRINTEC,PREVIFEM) 0.25-35 MG-MCG tablet Take 1 tablet by mouth daily.   Yes [provider]    Allergies as of 01/27/2024   (No Known Allergies)    Family History  Problem Relation Age of Onset   Diabetes Mother    Hypertension Father    Diabetes Father    Breast cancer Neg Hx     Social History   Socioeconomic History   Marital status: Married    Spouse name: Not on file   Number of children: Not on file   Years of education: Not on file   Highest education level: Not on file  Occupational History   Not on file  Tobacco Use   Smoking status: Never   Smokeless tobacco: Never  Vaping Use   Vaping status: Not on file  Substance and Sexual Activity   Alcohol use: No   Drug use: No   Sexual activity: Not on file  Other Topics Concern   Not on file  Social History Narrative   Not on file   Social Drivers of Health   Financial Resource Strain: Low Risk  (12/21/2023)    Received from [facility]   Overall Financial Resource Strain (CARDIA)    Difficulty of Paying Living Expenses: Not hard at all  Food Insecurity: Unknown (12/21/2023)   Received from [facility]   Hunger Vital Sign    Worried About Running Out of Food in the Last Year: Never true    Ran Out of Food in the Last Year: Not on file  Transportation Needs: No Transportation Needs (12/21/2023)   Received [facility] - Transportation    In the past 12 months, has lack of transportation kept you from medical appointments or from getting medications?: No    Lack of Transportation (Non-Medical): No  Physical Activity: Not on file  Stress: Not on file  Social Connections: Not on file  Intimate Partner Violence: Not on file    Review of Systems: See HPI, otherwise negative ROS  Physical Exam: There were no vitals taken for this visit. General:   Alert,  pleasant and cooperative in NAD Head:  Normocephalic and atraumatic. Neck:  Supple; no masses or thyromegaly. Lungs:  Clear throughout to auscultation, normal respiratory effort.    Heart:  +S1, +S2, Regular rate and rhythm, No edema. Abdomen:  Soft, nontender and nondistended. Normal bowel sounds, without guarding, and without  rebound.   Neurologic:  Alert and  oriented x4;  grossly normal neurologically.  Impression/Plan: Lisa Keller is here for an colonoscopy to be performed for rectal bleeding  Risks, benefits, limitations, and alternatives regarding  colonoscopy have been reviewed with the patient.  Questions have been answered.  All parties agreeable.   Luke Salaam, MD  03/24/2024, 9:55 AM

## 2024-03-24 NOTE — Op Note (Signed)
 [facility] Patient Name: Lisa Keller Procedure Date: 03/24/2024 10:35 AM MRN: [id number] Account #: [id number] Date of Birth: [birth year] Admit Type: Outpatient Age: 48 Room: [facility] ENDO ROOM 4 Gender: Female Note Status: Finalized Instrument Name: Charlyn Cooley [id number] Procedure:             Colonoscopy Indications:           Rectal bleeding Providers:             Luke Salaam MD, MD Referring MD:          No Local Md, MD (Referring MD) Medicines:             Monitored Anesthesia Care Complications:         No immediate complications. Procedure:             Pre-Anesthesia Assessment:                        - Prior to the procedure, a History and Physical was                         performed, and patient medications, allergies and                         sensitivities were reviewed. The patient's tolerance                         of previous anesthesia was reviewed.                        - The risks and benefits of the procedure and the                         sedation options and risks were discussed with the                         patient. All questions were answered and informed                         consent was obtained.                        - ASA Grade Assessment: II - A patient with mild                         systemic disease.                        After obtaining informed consent, the colonoscope was                         passed under direct vision. Throughout the procedure,                         the patient's blood pressure, pulse, and oxygen                         saturations were monitored continuously. The                         Colonoscope was introduced through the  anus and                         advanced to the the cecum, identified by the                         appendiceal orifice. The colonoscopy was performed                         with ease. The patient tolerated the procedure well.                         The  quality of the bowel preparation was good. The                         ileocecal valve, appendiceal orifice, and rectum were                         photographed. Findings:      The perianal and digital rectal examinations were normal.      A 10 mm polyp was found in the cecum. The polyp was sessile. The polyp       was removed with a cold snare. Resection and retrieval were complete. To       prevent bleeding post-intervention, one hemostatic clip was successfully       placed. Clip manufacturer: [employer]. There was no bleeding at       the end of the procedure.      A 4 mm polyp was found in the sigmoid colon. The polyp was sessile. The       polyp was removed with a cold snare. Resection and retrieval were       complete.      Multiple medium-mouthed diverticula were found in the sigmoid colon.      Non-bleeding internal hemorrhoids were found during retroflexion. The       hemorrhoids were medium-sized and Grade I (internal hemorrhoids that do       not prolapse).      The exam was otherwise without abnormality on direct and retroflexion       views. Impression:            - One 10 mm polyp in the cecum, removed with a cold                         snare. Resected and retrieved. Clip was placed. Clip                         manufacturer: [employer].                        - One 4 mm polyp in the sigmoid colon, removed with a                         cold snare. Resected and retrieved.                        - Diverticulosis in the sigmoid colon.                        - Non-bleeding internal hemorrhoids.                        -  The examination was otherwise normal on direct and                         retroflexion views. Recommendation:        - Discharge patient to home (with escort).                        - Resume previous diet.                        - Continue present medications.                        - Await pathology results.                        - Repeat  colonoscopy for surveillance based on                         pathology results.                        - Return to GI office as previously scheduled. Procedure Code(s):     --- Professional ---                        [phone], Colonoscopy, flexible; with removal of                         tumor(s), polyp(s), or other lesion(s) by snare                         technique Diagnosis Code(s):     --- Professional ---                        D12.0, Benign neoplasm of cecum                        D12.5, Benign neoplasm of sigmoid colon                        K64.0, First degree hemorrhoids                        K62.5, Hemorrhage of anus and rectum                        K57.30, Diverticulosis of large intestine without                         perforation or abscess without bleeding CPT copyright 2022 [employer]. All rights reserved. The codes documented in this report are preliminary and upon coder review may  be revised to meet current compliance requirements. Luke Salaam, MD Luke Salaam MD, MD 03/24/2024 11:03:11 AM This report has been signed electronically. Number of Addenda: 0 Note Initiated On: 03/24/2024 10:35 AM Scope Withdrawal Time: 0 hours 13 minutes 22 seconds  Total Procedure Duration: 0 hours 17 minutes 39 seconds  Estimated Blood Loss:  Estimated blood loss: none.      [facility]

## 2024-03-24 NOTE — Transfer of Care (Signed)
 Immediate Anesthesia Transfer of Care Note  Patient: Lisa Keller  Procedure(s) Performed: COLONOSCOPY WITH PROPOFOL  POLYPECTOMY, INTESTINE CONTROL OF HEMORRHAGE, GI TRACT, ENDOSCOPIC, BY CLIPPING OR OVERSEWING  Patient Location: PACU  Anesthesia Type:MAC  Level of Consciousness: sedated  Airway & Oxygen Therapy: Patient Spontanous Breathing  Post-op Assessment: Report given to RN and Post -op Vital signs reviewed and stable  Post vital signs: stable  Last Vitals:  Vitals Value Taken Time  BP    Temp    Pulse    Resp    SpO2      Last Pain:  Vitals:   03/24/24 1017  TempSrc: Tympanic         Complications: No notable events documented.

## 2024-03-25 ENCOUNTER — Encounter: Payer: Self-pay | Admitting: Gastroenterology

## 2024-03-27 LAB — SURGICAL PATHOLOGY

## 2024-03-28 ENCOUNTER — Encounter: Payer: Self-pay | Admitting: Gastroenterology
